# Patient Record
Sex: Female | Born: 1955 | Race: White | Hispanic: No | Marital: Married | Smoking: Former smoker
Health system: Southern US, Community
[De-identification: ages and names within clinical notes are randomized; demographics above are authoritative.]

## PROBLEM LIST (undated history)

## (undated) DIAGNOSIS — I10 Essential (primary) hypertension: Secondary | ICD-10-CM

## (undated) DIAGNOSIS — F419 Anxiety disorder, unspecified: Secondary | ICD-10-CM

## (undated) HISTORY — PX: NASAL SINUS SURGERY: SHX719

## (undated) HISTORY — DX: Essential (primary) hypertension: I10

## (undated) HISTORY — DX: Anxiety disorder, unspecified: F41.9

---

## 2000-11-29 ENCOUNTER — Encounter: Payer: Self-pay | Admitting: Family Medicine

## 2000-11-29 ENCOUNTER — Encounter: Admission: RE | Admit: 2000-11-29 | Discharge: 2000-11-29 | Payer: Self-pay | Admitting: Family Medicine

## 2015-10-04 ENCOUNTER — Emergency Department (HOSPITAL_COMMUNITY): Payer: BLUE CROSS/BLUE SHIELD

## 2015-10-04 ENCOUNTER — Emergency Department (HOSPITAL_COMMUNITY)
Admission: EM | Admit: 2015-10-04 | Discharge: 2015-10-04 | Disposition: A | Discharge status: Home / Self Care | Payer: BLUE CROSS/BLUE SHIELD | Attending: Emergency Medicine | Admitting: Emergency Medicine

## 2015-10-04 ENCOUNTER — Encounter (HOSPITAL_COMMUNITY): Payer: Self-pay | Admitting: *Deleted

## 2015-10-04 DIAGNOSIS — F419 Anxiety disorder, unspecified: Secondary | ICD-10-CM | POA: Insufficient documentation

## 2015-10-04 DIAGNOSIS — R0789 Other chest pain: Secondary | ICD-10-CM | POA: Diagnosis not present

## 2015-10-04 DIAGNOSIS — R0602 Shortness of breath: Secondary | ICD-10-CM | POA: Diagnosis present

## 2015-10-04 DIAGNOSIS — R51 Headache: Secondary | ICD-10-CM | POA: Diagnosis not present

## 2015-10-04 DIAGNOSIS — R030 Elevated blood-pressure reading, without diagnosis of hypertension: Secondary | ICD-10-CM | POA: Diagnosis not present

## 2015-10-04 LAB — URINALYSIS, ROUTINE W REFLEX MICROSCOPIC
Bilirubin Urine: NEGATIVE
Glucose, UA: NEGATIVE mg/dL
Ketones, ur: NEGATIVE mg/dL
NITRITE: NEGATIVE
PROTEIN: NEGATIVE mg/dL
SPECIFIC GRAVITY, URINE: 1.014 (ref 1.005–1.030)
pH: 6.5 (ref 5.0–8.0)

## 2015-10-04 LAB — CBC WITH DIFFERENTIAL/PLATELET
BASOS ABS: 0 10*3/uL (ref 0.0–0.1)
Basophils Relative: 0 %
Eosinophils Absolute: 0.1 10*3/uL (ref 0.0–0.7)
Eosinophils Relative: 1 %
HEMATOCRIT: 39.9 % (ref 36.0–46.0)
HEMOGLOBIN: 13.7 g/dL (ref 12.0–15.0)
LYMPHS PCT: 47 %
Lymphs Abs: 4.3 10*3/uL — ABNORMAL HIGH (ref 0.7–4.0)
MCH: 30.2 pg (ref 26.0–34.0)
MCHC: 34.3 g/dL (ref 30.0–36.0)
MCV: 88.1 fL (ref 78.0–100.0)
MONO ABS: 0.7 10*3/uL (ref 0.1–1.0)
Monocytes Relative: 8 %
NEUTROS ABS: 4 10*3/uL (ref 1.7–7.7)
NEUTROS PCT: 44 %
Platelets: 219 10*3/uL (ref 150–400)
RBC: 4.53 MIL/uL (ref 3.87–5.11)
RDW: 12.2 % (ref 11.5–15.5)
WBC: 9.2 10*3/uL (ref 4.0–10.5)

## 2015-10-04 LAB — BASIC METABOLIC PANEL
ANION GAP: 9 (ref 5–15)
BUN: 13 mg/dL (ref 6–20)
CHLORIDE: 104 mmol/L (ref 101–111)
CO2: 25 mmol/L (ref 22–32)
Calcium: 9.9 mg/dL (ref 8.9–10.3)
Creatinine, Ser: 0.7 mg/dL (ref 0.44–1.00)
GFR calc Af Amer: >60 mL/min (ref >60)
GFR calc non Af Amer: >60 mL/min (ref >60)
GLUCOSE: 107 mg/dL — AB (ref 65–99)
POTASSIUM: 3.5 mmol/L (ref 3.5–5.1)
Sodium: 138 mmol/L (ref 135–145)

## 2015-10-04 LAB — URINE MICROSCOPIC-ADD ON

## 2015-10-04 LAB — I-STAT TROPONIN, ED: Troponin i, poc: 0 ng/mL (ref 0.00–0.08)

## 2015-10-04 LAB — TROPONIN I: Troponin I: <0.03 ng/mL (ref <0.031)

## 2015-10-04 LAB — D-DIMER, QUANTITATIVE (NOT AT ARMC)

## 2015-10-04 NOTE — ED Notes (Signed)
Pt returned to room from restroom. Hooked pt back up to monitor.
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
## 2015-10-04 NOTE — ED Notes (Signed)
Patient presents with c/o feeling SOB  Noticed her BP has been elevated

## 2015-10-04 NOTE — Discharge Instructions (Signed)
Nonspecific Chest Pain  °There is no evidence of heart attack or blood clot in the lung. Follow up with your cardiologist. Return to the ED if you develop new or worsening symptoms. °Chest pain can be caused by many different conditions. There is always a chance that your pain could be related to something serious, such as a heart attack or a blood clot in your lungs. Chest pain can also be caused by conditions that are not life-threatening. If you have chest pain, it is very important to follow up with your health care provider. °CAUSES  °Chest pain can be caused by: °· Heartburn. °· Pneumonia or bronchitis. °· Anxiety or stress. °· Inflammation around your heart (pericarditis) or lung (pleuritis or pleurisy). °· A blood clot in your lung. °· A collapsed lung (pneumothorax). It can develop suddenly on its own (spontaneous pneumothorax) or from trauma to the chest. °· Shingles infection (varicella-zoster virus). °· Heart attack. °· Damage to the bones, muscles, and cartilage that make up your chest wall. This can include: °¨ Bruised bones due to injury. °¨ Strained muscles or cartilage due to frequent or repeated coughing or overwork. °¨ Fracture to one or more ribs. °¨ Sore cartilage due to inflammation (costochondritis). °RISK FACTORS  °Risk factors for chest pain may include: °· Activities that increase your risk for trauma or injury to your chest. °· Respiratory infections or conditions that cause frequent coughing. °· Medical conditions or overeating that can cause heartburn. °· Heart disease or family history of heart disease. °· Conditions or health behaviors that increase your risk of developing a blood clot. °· Having had chicken pox (varicella zoster). °SIGNS AND SYMPTOMS °Chest pain can feel like: °· Burning or tingling on the surface of your chest or deep in your chest. °· Crushing, pressure, aching, or squeezing pain. °· Dull or sharp pain that is worse when you move, cough, or take a deep  breath. °· Pain that is also felt in your back, neck, shoulder, or arm, or pain that spreads to any of these areas. °Your chest pain may come and go, or it may stay constant. °DIAGNOSIS °Lab tests or other studies may be needed to find the cause of your pain. Your health care provider may have you take a test called an ambulatory ECG (electrocardiogram). An ECG records your heartbeat patterns at the time the test is performed. You may also have other tests, such as: °· Transthoracic echocardiogram (TTE). During echocardiography, sound waves are used to create a picture of all of the heart structures and to look at how blood flows through your heart. °· Transesophageal echocardiogram (TEE). This is a more advanced imaging test that obtains images from inside your body. It allows your health care provider to see your heart in finer detail. °· Cardiac monitoring. This allows your health care provider to monitor your heart rate and rhythm in real time. °· Holter monitor. This is a portable device that records your heartbeat and can help to diagnose abnormal heartbeats. It allows your health care provider to track your heart activity for several days, if needed. °· Stress tests. These can be done through exercise or by taking medicine that makes your heart beat more quickly. °· Blood tests. °· Imaging tests. °TREATMENT  °Your treatment depends on what is causing your chest pain. Treatment may include: °· Medicines. These may include: °¨ Acid blockers for heartburn. °¨ Anti-inflammatory medicine. °¨ Pain medicine for inflammatory conditions. °¨ Antibiotic medicine, if an infection is present. °¨ Medicines   to dissolve blood clots.  Medicines to treat coronary artery disease.  Supportive care for conditions that do not require medicines. This may include:  Resting.  Applying heat or cold packs to injured areas.  Limiting activities until pain decreases. HOME CARE INSTRUCTIONS  If you were prescribed an  antibiotic medicine, finish it all even if you start to feel better.  Avoid any activities that bring on chest pain.  Do not use any tobacco products, including cigarettes, chewing tobacco, or electronic cigarettes. If you need help quitting, ask your health care provider.  Do not drink alcohol.  Take medicines only as directed by your health care provider.  Keep all follow-up visits as directed by your health care provider. This is important. This includes any further testing if your chest pain does not go away.  If heartburn is the cause for your chest pain, you may be told to keep your head raised (elevated) while sleeping. This reduces the chance that acid will go from your stomach into your esophagus.  Make lifestyle changes as directed by your health care provider. These may include:  Getting regular exercise. Ask your health care provider to suggest some activities that are safe for you.  Eating a heart-healthy diet. A registered dietitian can help you to learn healthy eating options.  Maintaining a healthy weight.  Managing diabetes, if necessary.  Reducing stress. SEEK MEDICAL CARE IF:  Your chest pain does not go away after treatment.  You have a rash with blisters on your chest.  You have a fever. SEEK IMMEDIATE MEDICAL CARE IF:   Your chest pain is worse.  You have an increasing cough, or you cough up blood.  You have severe abdominal pain.  You have severe weakness.  You faint.  You have chills.  You have sudden, unexplained chest discomfort.  You have sudden, unexplained discomfort in your arms, back, neck, or jaw.  You have shortness of breath at any time.  You suddenly start to sweat, or your skin gets clammy.  You feel nauseous or you vomit.  You suddenly feel light-headed or dizzy.  Your heart begins to beat quickly, or it feels like it is skipping beats. These symptoms may represent a serious problem that is an emergency. Do not wait to  see if the symptoms will go away. Get medical help right away. Call your local emergency services (911 in the [place] Do not drive yourself to the hospital.   This information is not intended to replace advice given to you by your health care provider. Make sure you discuss any questions you have with your health care provider.   Document Released: 01/24/2005 Document Revised: 05/07/2014 Document Reviewed: 11/20/2013 Elsevier Interactive Patient Education [employer].

## 2015-10-04 NOTE — ED Notes (Signed)
Pt ambulating to rest room.

## 2015-10-04 NOTE — ED Notes (Signed)
Pt ambulated in the hallway with ease. Heart rate 75 o2 98. No complaints,

## 2015-10-04 NOTE — ED Notes (Signed)
MD at bedside. 

## 2017-05-28 IMAGING — DX DG CHEST 2V
2 series · 2 of 2 positions shown · non-contrast
Comparison: None.

CLINICAL DATA: Shortness of breath and chest pain

EXAM:
CHEST  2 VIEW

[chest pa]
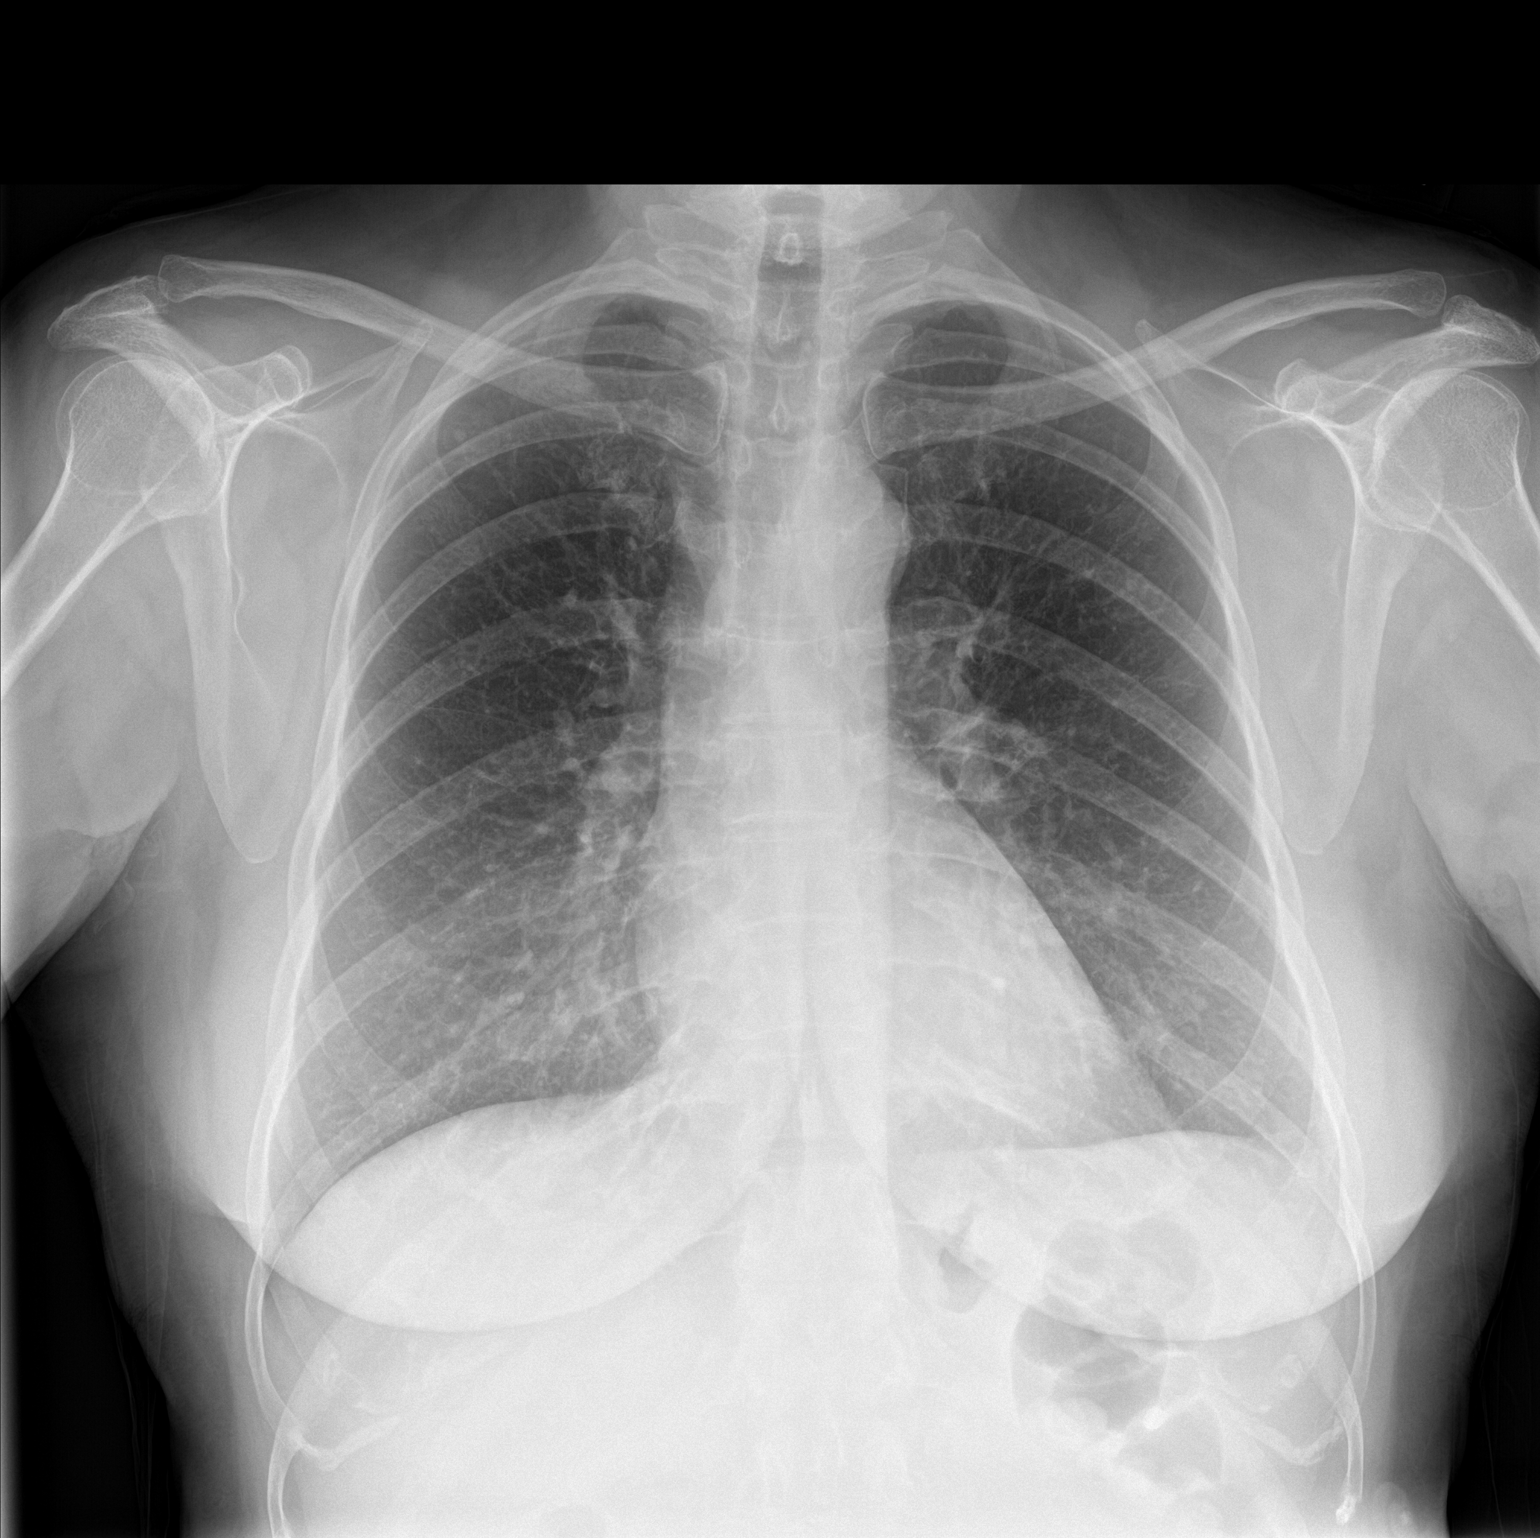

[chest lat]
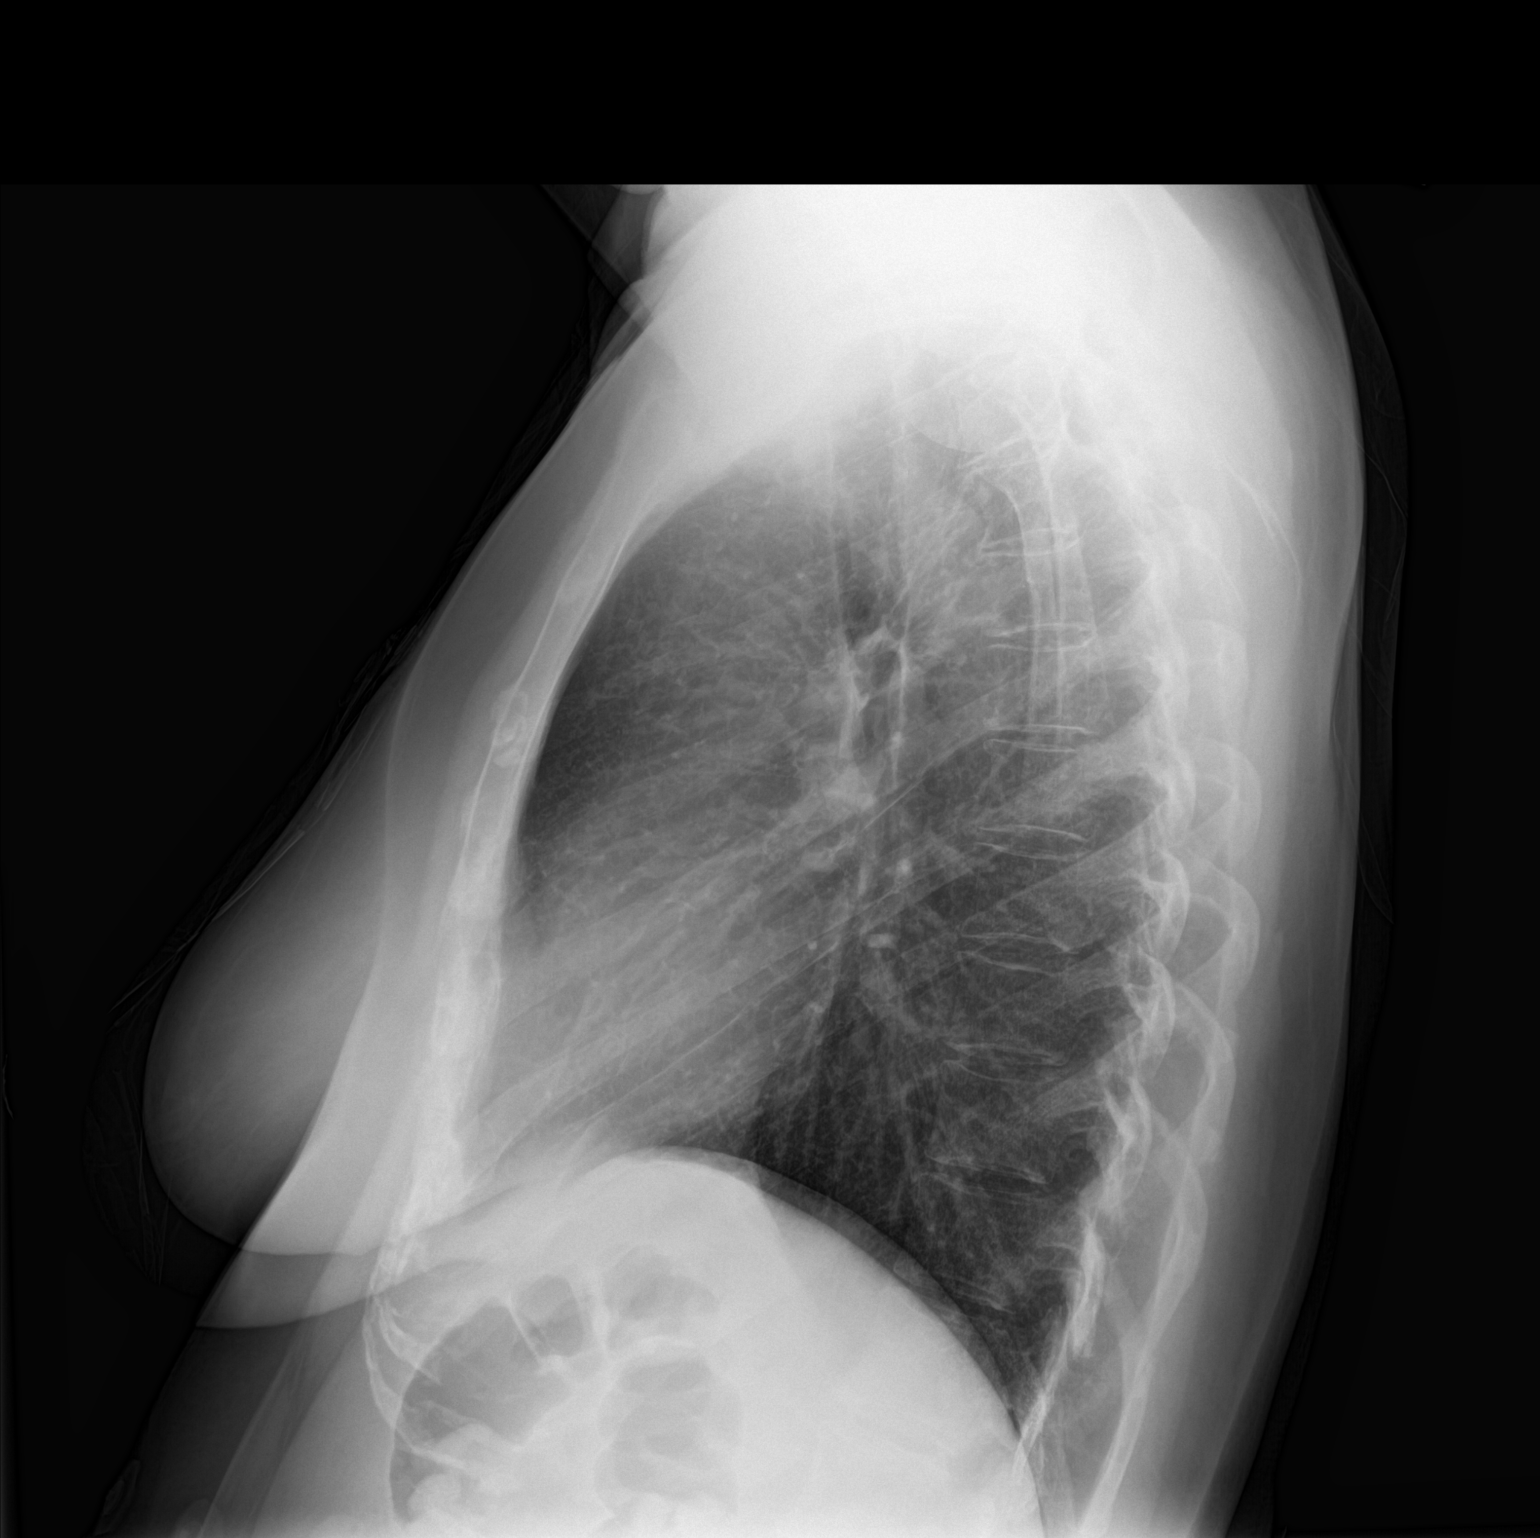

[2 of 2 positions shown; findings below may reference images not displayed]

FINDINGS: Normal heart size and mediastinal contours. No acute infiltrate or
edema. No effusion or pneumothorax. No osseous findings.
IMPRESSION: Negative chest.

## 2022-08-21 ENCOUNTER — Other Ambulatory Visit (HOSPITAL_COMMUNITY)
Admission: RE | Admit: 2022-08-21 | Discharge: 2022-08-21 | Disposition: A | Discharge status: Home / Self Care | Payer: BC Managed Care – PPO | Source: Other Acute Inpatient Hospital

## 2022-08-21 ENCOUNTER — Other Ambulatory Visit (HOSPITAL_COMMUNITY)
Admission: RE | Admit: 2022-08-21 | Discharge: 2022-08-21 | Disposition: A | Discharge status: Home / Self Care | Payer: BC Managed Care – PPO | Source: Ambulatory Visit | Attending: Obstetrics and Gynecology | Admitting: Obstetrics and Gynecology

## 2022-08-21 ENCOUNTER — Ambulatory Visit: Payer: BC Managed Care – PPO | Admitting: Obstetrics and Gynecology

## 2022-08-21 ENCOUNTER — Other Ambulatory Visit (HOSPITAL_COMMUNITY)
Admission: RE | Admit: 2022-08-21 | Discharge: 2022-08-21 | Disposition: A | Discharge status: Home / Self Care | Payer: BC Managed Care – PPO | Source: Ambulatory Visit

## 2022-08-21 ENCOUNTER — Encounter: Payer: Self-pay | Admitting: Obstetrics and Gynecology

## 2022-08-21 VITALS — BP 131/82 | HR 61 | Ht 65.5 in | Wt 154.0 lb

## 2022-08-21 DIAGNOSIS — N3281 Overactive bladder: Secondary | ICD-10-CM

## 2022-08-21 DIAGNOSIS — N8110 Cystocele, unspecified: Secondary | ICD-10-CM

## 2022-08-21 DIAGNOSIS — N393 Stress incontinence (female) (male): Secondary | ICD-10-CM | POA: Diagnosis not present

## 2022-08-21 DIAGNOSIS — R82998 Other abnormal findings in urine: Secondary | ICD-10-CM | POA: Insufficient documentation

## 2022-08-21 DIAGNOSIS — R350 Frequency of micturition: Secondary | ICD-10-CM

## 2022-08-21 LAB — POCT URINALYSIS DIPSTICK
Bilirubin, UA: NEGATIVE
Glucose, UA: NEGATIVE
Ketones, UA: NEGATIVE
Nitrite, UA: NEGATIVE
Protein, UA: NEGATIVE
Spec Grav, UA: 1.015 (ref 1.010–1.025)
Urobilinogen, UA: 0.2 E.U./dL
pH, UA: 5.5 (ref 5.0–8.0)

## 2022-08-21 NOTE — Patient Instructions (Signed)

## 2022-08-21 NOTE — Progress Notes (Signed)
[facility] New Patient Evaluation and Consultation  Referring Provider: Exie Hall PCP: Mikayla Hall Date of Service: 08/21/2022  SUBJECTIVE Chief Complaint: New Patient (Initial Visit) Mikayla Hall is a 67 y.o. female here for a consult for pelvic pain. Pt said she has leaking after leaking. Pt also said she has a family hx of bladder cancer./)  History of Present Illness: Mikayla Hall is a 67 y.o. White or Caucasian female seen in consultation at the request of PA Mikayla Hall for evaluation of urinary urgency.    Review of records significant for: Having some episodic pelvic pain  Urinary Symptoms: Leaks urine with cough/ sneeze, exercise, lifting, with movement to the bathroom, and with urgency Leaks a few times a week. UUI > SUI Pad use: none She is bothered by her UI symptoms.  Day time voids 10.  Nocturia: 0 times per night to void. Voiding dysfunction: she does not empty her bladder well.  does not use a catheter to empty bladder.  When urinating, she feels a weak stream, dribbling after finishing, and to push on her belly or vagina to empty bladder Drinks: 1 cup coffee in AM, small cup tea with lunch, water per day In December was having some lower pelvic discomfort with urination but reports it was not a UTI. Symptoms have improved.   UTIs: 2 UTI's in the last year.   Reports history of kidney or bladder stones- had never been tested but felt like she had some Has history of bladder cancer in the family.   Pelvic Organ Prolapse Symptoms:                  She Denies a feeling of a bulge the vaginal area.   Bowel Symptom: Bowel movements: 1 time(s) per day Stool consistency: soft  Straining: no.  Splinting: no.  Incomplete evacuation: no.  She Denies accidental bowel leakage / fecal incontinence Bowel regimen: none  Sexual Function Sexually active: yes.  Pain with sex: No  Pelvic Pain Denies pelvic pain   Past Medical  History:  Past Medical History:  Diagnosis Date   Anxiety    Hypertension      Past Surgical History:   Past Surgical History:  Procedure Laterality Date   NASAL SINUS SURGERY       Past OB/GYN History: OB History  Gravida Para Term Preterm AB Living  SAB IAB Ectopic Multiple Live Births          3    # Outcome Date GA Lbr Len/2nd Weight Sex Delivery Anes PTL Lv  3 Term      Vag-Spont     2 Term      Vag-Spont     1 Term      Vag-Spont      Menopausal: Yes, at age 53, Denies vaginal bleeding since menopause Unsure of last pap smear- maybe 3 years ago with PCP, normal   Medications: She has a current medication list which includes the following prescription(s): lorazepam and ramipril.   Allergies: Patient has No Known Allergies.   Social History:  Social History   Tobacco Use   Smoking status: Former    Types: Cigarettes    Quit date: 2005    Years since quitting: 19.3   Smokeless tobacco: Never  Vaping Use   Vaping Use: Never used  Substance Use Topics   Alcohol use: Yes    Comment: ocassionally  Drug use: No    Relationship status: married   Family History:   Family History  Problem Relation Age of Onset   Hypertension Mother    Cancer Mother    Diabetes Father    Bladder Cancer Father    Kidney disease Brother    Bladder Cancer Paternal Uncle      Review of Systems: Review of Systems  Constitutional:  Negative for fever, malaise/fatigue and weight loss.  Respiratory:  Negative for cough, shortness of breath and wheezing.   Cardiovascular:  Positive for palpitations and leg swelling. Negative for chest pain.  Gastrointestinal:  Negative for abdominal pain and blood in stool.  Genitourinary:  Positive for dysuria.  Musculoskeletal:  Negative for myalgias.  Skin:  Negative for rash.  Neurological:  Negative for dizziness and headaches.  Endo/Heme/Allergies:  Does not bruise/bleed easily.  Psychiatric/Behavioral:  Negative for  depression. The patient is not nervous/anxious.      OBJECTIVE Physical Exam: Vitals:   08/21/22 0939  BP: (!) 154/88  Pulse: 70  Weight: 154 lb (69.9 kg)  Height: 5' 5.5" (1.664 m)    Physical Exam Constitutional:      General: She is not in acute distress. Pulmonary:     Effort: Pulmonary effort is normal.  Abdominal:     General: There is no distension.     Palpations: Abdomen is soft.     Tenderness: There is no abdominal tenderness. There is no rebound.  Musculoskeletal:        General: No swelling. Normal range of motion.  Skin:    General: Skin is warm and dry.     Findings: No rash.  Neurological:     Mental Status: She is alert and oriented to person, place, and time.  Psychiatric:        Mood and Affect: Mood normal.        Behavior: Behavior normal.      GU / Detailed Urogynecologic Evaluation:  Pelvic Exam: Normal external female genitalia; Bartholin's and Skene's glands normal in appearance; urethral meatus normal in appearance, no urethral masses or discharge.   CST: negative  Speculum exam reveals normal vaginal mucosa without atrophy. Cervix normal appearance. Uterus normal single, nontender. Adnexa no mass, fullness, tenderness.     Pelvic floor strength I/V  Pelvic floor musculature: Right levator non-tender, Right obturator non-tender, Left levator non-tender, Left obturator non-tender  POP-Q:   POP-Q  -0.5                                            Aa   -0.5                                           Ba  -6                                              C   3  Gh  3                                            Pb  7.5                                            tvl   -2                                            Ap  -2                                            Bp  -7.5                                              D      Rectal Exam:  Normal external rectum  Post-Void Residual (PVR)  by Bladder Scan: In order to evaluate bladder emptying, we discussed obtaining a postvoid residual and she agreed to this procedure.  Procedure: The ultrasound unit was placed on the patient's abdomen in the suprapubic region after the patient had voided. A PVR of 6 ml was obtained by bladder scan.  Laboratory Results: POC urine: moderate leukocytes   ASSESSMENT AND PLAN Mikayla Hall is a 67 y.o. with:  1. Overactive bladder   2. Urinary frequency   3. Leukocytes in urine   4. SUI (stress urinary incontinence, female)   5. Prolapse of anterior vaginal wall    OAB - We discussed the symptoms of overactive bladder (OAB), which include urinary urgency, urinary frequency, nocturia, with or without urge incontinence.  While we do not know the exact etiology of OAB, several treatment options exist. We discussed management including behavioral therapy (decreasing bladder irritants, urge suppression strategies, timed voids, bladder retraining), physical therapy, medication; for refractory cases posterior tibial nerve stimulation, sacral neuromodulation, and intravesical botulinum toxin injection.  - She would like to start with pelvic PT, referral placed.  - Will send urine today for culture due to presence of leukocytes - Reviewed avoiding bladder irritants, list provided - No sign of pelvic floor muscle tension on exam today, but we discussed that sometimes this can be the cause of increased urgency or discomfort as well.   2. SUI - For treatment of stress urinary incontinence,  options include expectant management, weight loss, physical therapy, as well as a pessary or surgical options - She will start with pelvic PT, not as bothersome as urgency  3. Stage II anterior, Stage I posterior, Stage I apical prolapse - Asymptomatic from prolapse currently. We discussed that pelvic PT can help prevent progression.     Marguerita Beards, MD

## 2022-08-23 LAB — URINE CULTURE: Culture: NO GROWTH

## 2023-02-13 ENCOUNTER — Other Ambulatory Visit: Payer: Self-pay | Admitting: Gastroenterology

## 2023-02-13 DIAGNOSIS — R1013 Epigastric pain: Secondary | ICD-10-CM

## 2023-02-13 DIAGNOSIS — R634 Abnormal weight loss: Secondary | ICD-10-CM

## 2023-07-23 DIAGNOSIS — J0140 Acute pansinusitis, unspecified: Secondary | ICD-10-CM | POA: Diagnosis not present

## 2023-07-23 DIAGNOSIS — I479 Paroxysmal tachycardia, unspecified: Secondary | ICD-10-CM | POA: Diagnosis not present

## 2023-07-23 DIAGNOSIS — R0789 Other chest pain: Secondary | ICD-10-CM | POA: Diagnosis not present

## 2023-09-10 DIAGNOSIS — H2513 Age-related nuclear cataract, bilateral: Secondary | ICD-10-CM | POA: Diagnosis not present

## 2023-09-11 DIAGNOSIS — B9689 Other specified bacterial agents as the cause of diseases classified elsewhere: Secondary | ICD-10-CM | POA: Diagnosis not present

## 2023-09-11 DIAGNOSIS — J208 Acute bronchitis due to other specified organisms: Secondary | ICD-10-CM | POA: Diagnosis not present

## 2023-09-11 DIAGNOSIS — R053 Chronic cough: Secondary | ICD-10-CM | POA: Diagnosis not present

## 2023-11-30 DIAGNOSIS — Z1231 Encounter for screening mammogram for malignant neoplasm of breast: Secondary | ICD-10-CM | POA: Diagnosis not present

## 2023-11-30 DIAGNOSIS — R92323 Mammographic fibroglandular density, bilateral breasts: Secondary | ICD-10-CM | POA: Diagnosis not present

## 2023-12-18 DIAGNOSIS — Z0000 Encounter for general adult medical examination without abnormal findings: Secondary | ICD-10-CM | POA: Diagnosis not present

## 2023-12-18 DIAGNOSIS — I10 Essential (primary) hypertension: Secondary | ICD-10-CM | POA: Diagnosis not present

## 2023-12-18 DIAGNOSIS — Z0001 Encounter for general adult medical examination with abnormal findings: Secondary | ICD-10-CM | POA: Diagnosis not present

## 2023-12-18 DIAGNOSIS — Z13220 Encounter for screening for lipoid disorders: Secondary | ICD-10-CM | POA: Diagnosis not present

## 2023-12-18 DIAGNOSIS — Z1329 Encounter for screening for other suspected endocrine disorder: Secondary | ICD-10-CM | POA: Diagnosis not present

## 2023-12-18 DIAGNOSIS — M26622 Arthralgia of left temporomandibular joint: Secondary | ICD-10-CM | POA: Diagnosis not present

## 2023-12-18 DIAGNOSIS — F411 Generalized anxiety disorder: Secondary | ICD-10-CM | POA: Diagnosis not present

## 2024-03-12 DIAGNOSIS — R1013 Epigastric pain: Secondary | ICD-10-CM | POA: Diagnosis not present

## 2024-03-12 DIAGNOSIS — R0789 Other chest pain: Secondary | ICD-10-CM | POA: Diagnosis not present

## 2024-03-13 DIAGNOSIS — R1013 Epigastric pain: Secondary | ICD-10-CM | POA: Diagnosis not present

## 2024-03-13 DIAGNOSIS — K760 Fatty (change of) liver, not elsewhere classified: Secondary | ICD-10-CM | POA: Diagnosis not present
